# Patient Record
Sex: Male | Born: 1995 | Hispanic: No | Marital: Single | Smoking: Never smoker
Health system: Southern US, Community
[De-identification: ages and names within clinical notes are randomized; demographics above are authoritative.]

---
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
## 2013-02-11 HISTORY — PX: KNEE SURGERY: SHX244

## 2016-07-22 ENCOUNTER — Encounter: Payer: Self-pay | Admitting: Family Medicine

## 2016-07-22 ENCOUNTER — Ambulatory Visit (INDEPENDENT_AMBULATORY_CARE_PROVIDER_SITE_OTHER): Payer: BLUE CROSS/BLUE SHIELD | Admitting: Family Medicine

## 2016-07-22 VITALS — BP 122/80 | HR 58 | Temp 97.4°F | Ht 73.0 in | Wt 266.1 lb

## 2016-07-22 DIAGNOSIS — Z23 Encounter for immunization: Secondary | ICD-10-CM

## 2016-07-22 DIAGNOSIS — R21 Rash and other nonspecific skin eruption: Secondary | ICD-10-CM

## 2016-07-22 DIAGNOSIS — R635 Abnormal weight gain: Secondary | ICD-10-CM | POA: Diagnosis not present

## 2016-07-22 DIAGNOSIS — R5383 Other fatigue: Secondary | ICD-10-CM | POA: Diagnosis not present

## 2016-07-22 LAB — HEPATIC FUNCTION PANEL
ALBUMIN: 4.9 g/dL (ref 3.5–5.2)
ALT: 38 U/L (ref 0–53)
AST: 23 U/L (ref 0–37)
Alkaline Phosphatase: 76 U/L (ref 39–117)
Bilirubin, Direct: 0.1 mg/dL (ref 0.0–0.3)
Total Bilirubin: 0.9 mg/dL (ref 0.2–1.2)
Total Protein: 7.3 g/dL (ref 6.0–8.3)

## 2016-07-22 LAB — CBC WITH DIFFERENTIAL/PLATELET
BASOS PCT: 0.7 % (ref 0.0–3.0)
Basophils Absolute: 0 10*3/uL (ref 0.0–0.1)
EOS PCT: 1.7 % (ref 0.0–5.0)
Eosinophils Absolute: 0.1 10*3/uL (ref 0.0–0.7)
HEMATOCRIT: 43.5 % (ref 39.0–52.0)
HEMOGLOBIN: 14.8 g/dL (ref 13.0–17.0)
LYMPHS PCT: 48.1 % — AB (ref 12.0–46.0)
Lymphs Abs: 2.7 10*3/uL (ref 0.7–4.0)
MCHC: 33.9 g/dL (ref 30.0–36.0)
MCV: 85.5 fl (ref 78.0–100.0)
Monocytes Absolute: 0.3 10*3/uL (ref 0.1–1.0)
Monocytes Relative: 5.9 % (ref 3.0–12.0)
Neutro Abs: 2.5 10*3/uL (ref 1.4–7.7)
Neutrophils Relative %: 43.6 % (ref 43.0–77.0)
Platelets: 318 10*3/uL (ref 150.0–400.0)
RBC: 5.09 Mil/uL (ref 4.22–5.81)
RDW: 12.6 % (ref 11.5–15.5)
WBC: 5.7 10*3/uL (ref 4.0–10.5)

## 2016-07-22 LAB — TSH: TSH: 2.24 u[IU]/mL (ref 0.35–4.50)

## 2016-07-22 LAB — BASIC METABOLIC PANEL
BUN: 11 mg/dL (ref 6–23)
CHLORIDE: 104 meq/L (ref 96–112)
CO2: 27 mEq/L (ref 19–32)
Calcium: 10 mg/dL (ref 8.4–10.5)
Creatinine, Ser: 1 mg/dL (ref 0.40–1.50)
GFR: 98.53 mL/min (ref >60.00)
Glucose, Bld: 84 mg/dL (ref 70–99)
POTASSIUM: 4.3 meq/L (ref 3.5–5.1)
Sodium: 138 mEq/L (ref 135–145)

## 2016-07-22 MED ORDER — BETAMETHASONE DIPROPIONATE 0.05 % EX CREA: TOPICAL_CREAM | Freq: Two times a day (BID) | CUTANEOUS | 2 refills | Status: DC | PRN

## 2016-07-22 NOTE — Progress Notes (Signed)
Subjective:     Patient ID: Shaun CardDaniel Trevino, male   DOB: Sep 21, 1995, 21 y.o.   MRN: [id number]  HPI Patient here to establish care. He currently lives in [place] and is finishing school there ([employer]). He is working part-time. Past medical history reviewed. He has no significant chronic problems. He had right knee surgery 2015 for anterior cruciate ligament tear. Currently takes no medications. No other surgeries. Nonsmoker. No illicit drug use. Last tetanus 2007  Patient has the following concerns today  Increasing fatigue and weight gain over the past few years. He's gained about 30 pounds. He gets about 6-7 hours sleep per night. Denies any adenopathy, headaches, night sweats, polyuria, polydipsia. He is exercising but somewhat inconsistently. Does eat out frequently. Mom notes that he snores but he denies any consistent increased daytime somnolence. No observed apnea.  Recurrent skin rash involving mostly fingertips with frequent peeling. He was prescribed antifungals without improvement. He does not recall having cultures done previously.  No hx of pustular rash.  No past medical history on file. Past Surgical History:  Procedure Laterality Date  . KNEE SURGERY Right 2015   ACL - torn meniscus    reports that he has never smoked. He has never used smokeless tobacco. He reports that he does not drink alcohol or use drugs. family history includes Heart disease in his maternal grandfather; Hypertension in his maternal grandmother and mother; Stomach cancer in his father and paternal grandfather; Stroke in his paternal grandmother; Thyroid disease in his mother. Not on File   Review of Systems  Constitutional: Positive for fatigue and unexpected weight change. Negative for appetite change.  Respiratory: Negative for shortness of breath.   Cardiovascular: Negative for chest pain.  Gastrointestinal: Negative for abdominal pain, constipation and diarrhea.  Endocrine: Negative for polydipsia and  polyuria.  Skin: Positive for rash.  Neurological: Negative for dizziness and headaches.  Hematological: Negative for adenopathy.       Objective:   Physical Exam  Constitutional: He appears well-developed and well-nourished.  Neck: Neck supple. No thyromegaly present.  Cardiovascular: Normal rate and regular rhythm.   No murmur heard. Pulmonary/Chest: Effort normal and breath sounds normal. No respiratory distress. He has no wheezes.  Abdominal: Soft. Bowel sounds are normal. He exhibits no distension and no mass. There is no tenderness. There is no rebound and no guarding.  Musculoskeletal: He exhibits no edema.  Lymphadenopathy:    He has no cervical adenopathy.  Skin: Rash noted.  Mild dryness and scaling of distal fingers of both hands.  Minimal scaling of palms.  No pustules.       Assessment:     #1 weight gain over the past few years.  #2 increased fatigue. May be related to weight gain and relative inactivity. Consider ruling out sleep apnea.  Rule out thyroid disorder.  #3 chronic recurrent rash involving his hands. Unresponsive to antifungals.  ?eczematous.  No evidence for pustular psoriasis.      Plan:     -Obtain screening labs with TSH, basic metabolic panel, CBC -We discussed strategies for weight loss-eating out less often, more exercise, portion control, lower glycemic intake, consider calorie tracking app.   -Tetanus booster given -Trial of beclomethasone cream to use twice daily as needed no more than 2 weeks continuously. -consider scraping for KOH and fungal cx if not responding to above.  Kristian CoveyBruce W Margorie Renner MD [facility]

## 2016-08-01 ENCOUNTER — Telehealth: Payer: Self-pay | Admitting: Family Medicine

## 2016-08-01 NOTE — Telephone Encounter (Signed)
The patient's mother called stating that she has received 5 calls and no voice mails from our office. I advised her that the nurse is out of the office and that Reuel BoomDaniel will be called tomorrow when the [facility] nurse returns to the office.

## 2016-08-02 NOTE — Telephone Encounter (Signed)
Left message on machine for patient to return our call to review lab results 

## 2016-08-05 ENCOUNTER — Encounter: Payer: Self-pay | Admitting: *Deleted

## 2016-08-05 NOTE — Telephone Encounter (Signed)
Letter mailed to home address requesting patient to give our office a call

## 2016-10-31 ENCOUNTER — Encounter: Payer: Self-pay | Admitting: Family Medicine

## 2017-08-08 ENCOUNTER — Encounter: Payer: Self-pay | Admitting: Family Medicine

## 2017-08-08 ENCOUNTER — Ambulatory Visit (INDEPENDENT_AMBULATORY_CARE_PROVIDER_SITE_OTHER): Payer: BLUE CROSS/BLUE SHIELD | Admitting: Family Medicine

## 2017-08-08 VITALS — BP 118/82 | HR 73 | Temp 97.6°F | Ht 74.75 in | Wt 269.2 lb

## 2017-08-08 DIAGNOSIS — Z0000 Encounter for general adult medical examination without abnormal findings: Secondary | ICD-10-CM | POA: Diagnosis not present

## 2017-08-08 LAB — CBC WITH DIFFERENTIAL/PLATELET
BASOS ABS: 0 10*3/uL (ref 0.0–0.1)
Basophils Relative: 0.6 % (ref 0.0–3.0)
EOS ABS: 0.2 10*3/uL (ref 0.0–0.7)
Eosinophils Relative: 2.8 % (ref 0.0–5.0)
HCT: 44.9 % (ref 39.0–52.0)
Hemoglobin: 15.7 g/dL (ref 13.0–17.0)
LYMPHS ABS: 3 10*3/uL (ref 0.7–4.0)
Lymphocytes Relative: 42.2 % (ref 12.0–46.0)
MCHC: 35 g/dL (ref 30.0–36.0)
MCV: 84.3 fl (ref 78.0–100.0)
MONO ABS: 0.4 10*3/uL (ref 0.1–1.0)
Monocytes Relative: 6 % (ref 3.0–12.0)
NEUTROS PCT: 48.4 % (ref 43.0–77.0)
Neutro Abs: 3.5 10*3/uL (ref 1.4–7.7)
PLATELETS: 368 10*3/uL (ref 150.0–400.0)
RBC: 5.32 Mil/uL (ref 4.22–5.81)
RDW: 12.8 % (ref 11.5–15.5)
WBC: 7.2 10*3/uL (ref 4.0–10.5)

## 2017-08-08 LAB — BASIC METABOLIC PANEL
BUN: 10 mg/dL (ref 6–23)
CHLORIDE: 101 meq/L (ref 96–112)
CO2: 26 meq/L (ref 19–32)
Calcium: 10.3 mg/dL (ref 8.4–10.5)
Creatinine, Ser: 0.95 mg/dL (ref 0.40–1.50)
GFR: 103.59 mL/min (ref >60.00)
Glucose, Bld: 81 mg/dL (ref 70–99)
POTASSIUM: 4.3 meq/L (ref 3.5–5.1)
Sodium: 139 mEq/L (ref 135–145)

## 2017-08-08 LAB — HEPATIC FUNCTION PANEL
ALT: 42 U/L (ref 0–53)
AST: 20 U/L (ref 0–37)
Albumin: 4.9 g/dL (ref 3.5–5.2)
Alkaline Phosphatase: 91 U/L (ref 39–117)
BILIRUBIN DIRECT: 0.1 mg/dL (ref 0.0–0.3)
BILIRUBIN TOTAL: 0.8 mg/dL (ref 0.2–1.2)
TOTAL PROTEIN: 7.6 g/dL (ref 6.0–8.3)

## 2017-08-08 LAB — LDL CHOLESTEROL, DIRECT: Direct LDL: 166 mg/dL

## 2017-08-08 LAB — LIPID PANEL
CHOL/HDL RATIO: 6
Cholesterol: 239 mg/dL — ABNORMAL HIGH (ref 0–200)
HDL: 41.5 mg/dL (ref >39.00)
NonHDL: 197.84
TRIGLYCERIDES: 293 mg/dL — AB (ref 0.0–149.0)
VLDL: 58.6 mg/dL — AB (ref 0.0–40.0)

## 2017-08-08 LAB — TSH: TSH: 2.67 u[IU]/mL (ref 0.35–4.50)

## 2017-08-08 NOTE — Patient Instructions (Signed)
Lose some weight, as discussed  Try OTC hydrocortisone as needed for eczema flares.

## 2017-08-08 NOTE — Progress Notes (Signed)
  Subjective:     Patient ID: Shaun Trevino, male   DOB: 1995/12/17, 22 y.o.   MRN: [id number]  HPI Patient seen for physical exam. He will graduate from 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 Generally very healthy. He does want to lose some weight. He has some issues with daytime fatigue but no daytime somnolence. History of snoring but no observed sleep apnea.  Has some chronic eczema issues involving the scalp and ears. Uses dandruff shampoo intermittently. Also has what sounds like keratosis pilaris involving upper arms. Minimally symptomatic  Tetanus up-to-date.  No past medical history on file.   reports that he has never smoked. He has never used smokeless tobacco. He reports that he does not drink alcohol or use drugs. family history includes Heart disease in his maternal grandfather; Hypertension in his maternal grandmother and mother; Stomach cancer in his father and paternal grandfather; Stroke in his paternal grandmother; Thyroid disease in his mother. Not on File   Review of Systems  Constitutional: Positive for fatigue. Negative for activity change, appetite change, fever and unexpected weight change.  HENT: Negative for congestion, ear pain and trouble swallowing.   Eyes: Negative for pain and visual disturbance.  Respiratory: Negative for cough, shortness of breath and wheezing.   Cardiovascular: Negative for chest pain and palpitations.  Gastrointestinal: Negative for abdominal distention, abdominal pain, blood in stool, constipation, diarrhea, nausea, rectal pain and vomiting.  Genitourinary: Negative for dysuria, hematuria and testicular pain.  Musculoskeletal: Negative for arthralgias and joint swelling.  Skin: Positive for rash.  Neurological: Negative for dizziness and syncope.  Hematological: Negative for adenopathy.  Psychiatric/Behavioral: Negative for confusion and dysphoric mood.       Objective:   Physical Exam  Constitutional: He  is oriented to person, place, and time. He appears well-developed and well-nourished. No distress.  HENT:  Head: Normocephalic and atraumatic.  Right Ear: External ear normal.  Left Ear: External ear normal.  Mouth/Throat: Oropharynx is clear and moist.  Eyes: Pupils are equal, round, and reactive to light. Conjunctivae and EOM are normal.  Neck: Normal range of motion. Neck supple. No thyromegaly present.  Cardiovascular: Normal rate, regular rhythm and normal heart sounds.  No murmur heard. Pulmonary/Chest: No respiratory distress. He has no wheezes. He has no rales.  Abdominal: Soft. Bowel sounds are normal. He exhibits no distension and no mass. There is no tenderness. There is no rebound and no guarding.  Musculoskeletal: He exhibits no edema.  Lymphadenopathy:    He has no cervical adenopathy.  Neurological: He is alert and oriented to person, place, and time. He displays normal reflexes. No cranial nerve deficit.  Skin: Rash noted.  Patient has prominent follicles upper arms bilaterally consistent with keratosis pilaris.  Psychiatric: He has a normal mood and affect.       Assessment:     Physical exam. The following issues were addressed    Plan:     -Obtain screening lab work. Patient has questions regarding what his cholesterol status is -Establish more consistent exercise -Would like him to lose at least 20-30 pounds to see if this helps improve his stamina.  If not, consider sleep study to rule out OSA.  Kristian CoveyBruce W Burchette MD [facility]

## 2017-08-11 ENCOUNTER — Other Ambulatory Visit: Payer: Self-pay | Admitting: Family Medicine

## 2017-08-11 DIAGNOSIS — E785 Hyperlipidemia, unspecified: Secondary | ICD-10-CM

## 2017-08-12 ENCOUNTER — Telehealth: Payer: Self-pay | Admitting: Family Medicine

## 2017-08-12 ENCOUNTER — Other Ambulatory Visit: Payer: Self-pay | Admitting: Family Medicine

## 2017-08-12 DIAGNOSIS — E785 Hyperlipidemia, unspecified: Secondary | ICD-10-CM

## 2017-08-12 NOTE — Telephone Encounter (Signed)
Copied from CRM [id number]. Topic: Quick Communication - See Telephone Encounter >> Aug 12, 2017  2:40 PM Arlyss Gandyichardson, Nikolay Demetriou N, NT wrote: CRM for notification. See Telephone encounter for: 08/12/17. Pt would like a call from the nurse to go over his lab results.

## 2017-08-12 NOTE — Telephone Encounter (Signed)
Patient is aware of lab results.
